# Patient Record
Sex: Male | Born: 1970 | Race: Black or African American | Hispanic: No | Marital: Single | Smoking: Current some day smoker
Health system: Southern US, Community
[De-identification: ages and names within clinical notes are randomized; demographics above are authoritative.]

## PROBLEM LIST (undated history)

## (undated) HISTORY — PX: HERNIA REPAIR: SHX51

## (undated) HISTORY — PX: MENISCUS REPAIR: SHX5179

---

## 2002-01-18 ENCOUNTER — Encounter: Admission: RE | Admit: 2002-01-18 | Discharge: 2002-01-18 | Payer: Self-pay | Admitting: General Surgery

## 2002-01-18 ENCOUNTER — Encounter: Payer: Self-pay | Admitting: General Surgery

## 2002-10-17 ENCOUNTER — Emergency Department (HOSPITAL_COMMUNITY): Admission: EM | Admit: 2002-10-17 | Discharge: 2002-10-17 | Payer: Self-pay | Admitting: Emergency Medicine

## 2006-02-04 ENCOUNTER — Emergency Department (HOSPITAL_COMMUNITY): Admission: EM | Admit: 2006-02-04 | Discharge: 2006-02-04 | Payer: Self-pay | Admitting: Emergency Medicine

## 2010-01-01 ENCOUNTER — Emergency Department (HOSPITAL_COMMUNITY): Admission: EM | Admit: 2010-01-01 | Discharge: 2010-01-01 | Payer: Self-pay | Admitting: Emergency Medicine

## 2011-01-24 ENCOUNTER — Other Ambulatory Visit: Payer: Self-pay | Admitting: Orthopedic Surgery

## 2011-01-24 DIAGNOSIS — M542 Cervicalgia: Secondary | ICD-10-CM

## 2011-01-27 ENCOUNTER — Ambulatory Visit
Admission: RE | Admit: 2011-01-27 | Discharge: 2011-01-27 | Disposition: A | Discharge status: Home / Self Care | Payer: Medicare HMO | Source: Ambulatory Visit | Attending: Orthopedic Surgery | Admitting: Orthopedic Surgery

## 2011-01-27 DIAGNOSIS — M542 Cervicalgia: Secondary | ICD-10-CM

## 2013-08-02 ENCOUNTER — Other Ambulatory Visit (HOSPITAL_COMMUNITY): Payer: Self-pay | Admitting: Specialist

## 2013-08-02 ENCOUNTER — Ambulatory Visit (HOSPITAL_COMMUNITY)
Admission: RE | Admit: 2013-08-02 | Discharge: 2013-08-02 | Disposition: A | Discharge status: Home / Self Care | Payer: BC Managed Care – PPO | Source: Ambulatory Visit | Attending: Specialist | Admitting: Specialist

## 2013-08-02 DIAGNOSIS — M7989 Other specified soft tissue disorders: Secondary | ICD-10-CM

## 2013-08-02 DIAGNOSIS — M25561 Pain in right knee: Secondary | ICD-10-CM

## 2013-08-02 DIAGNOSIS — M79609 Pain in unspecified limb: Secondary | ICD-10-CM | POA: Insufficient documentation

## 2013-09-20 ENCOUNTER — Encounter: Payer: Self-pay | Admitting: Podiatry

## 2013-09-20 ENCOUNTER — Ambulatory Visit (INDEPENDENT_AMBULATORY_CARE_PROVIDER_SITE_OTHER): Payer: BC Managed Care – PPO | Admitting: Podiatry

## 2013-09-20 VITALS — BP 158/97 | HR 96 | Resp 20 | Ht 69.0 in | Wt 310.0 lb

## 2013-09-20 DIAGNOSIS — M722 Plantar fascial fibromatosis: Secondary | ICD-10-CM

## 2013-09-20 NOTE — Patient Instructions (Signed)
Shoe size may need to be increased when orthotics are dispensed. Do not purchase any new shoes until the orthotics are dispensed.

## 2013-09-20 NOTE — Progress Notes (Signed)
  Subjective:    Patient ID: Curtis Warner., male    DOB: 1971-01-17, 42 y.o.   MRN: [id number] "I had a Meniscus tear in my right knee.  I had orthotics before.  I want to get more to see if that will help."  HPI Comments:  N  Orthotics old, worn out L  Orthotics  D  4 yrs. ago O  Gradually C  Gotten worse A  None T  None   Patient is complaining of diffuse foot pain with walking. He has a history of wearing orthotics which relieved his foot pain and secondarily seemed to improve his knee pain. He has reduced his physical activity because of knee pain and foot pain resulting in increase in weight.    Review of Systems  Constitutional: Positive for activity change.  HENT: Negative.   Eyes: Negative.   Respiratory: Negative.   Cardiovascular: Positive for chest pain.  Gastrointestinal: Negative.   Endocrine: Negative.   Genitourinary: Negative.   Musculoskeletal: Positive for gait problem and joint swelling.  Skin: Negative.   Allergic/Immunologic: Positive for food allergies.  Neurological: Negative.   Hematological: Negative.   Psychiatric/Behavioral: Negative.    Surgical history: Recent surgical repair of meniscus  right knee.    Objective:   Physical Exam 42 year old black male appears orientated x3.  Vascular: DP and PT pulses are two over four bilaterally.  Neurological: Sensation intact bilaterally.  Musculoskeletal: Palpable tenderness in the medial fascial bands bilaterally without any palpable lesions. The calcaneal stance position is in a varus position bilaterally. There is no restriction ankle subtalar midtarsal joints bilaterally.        Assessment & Plan:   Assessment: Plantar fasciitis bilaterally Rear foot varus deformity bilaterally  Plan: Recommend custom foot orthotics made from plasterer model. The rear foot is posted 3  varus bilaterally. The forefoot is intrinsic posted to cast. Wide and deep heels and omit plaster from the  longitudinal arch bilaterally. The orthotic is a sport and extends to the MPJ. Obtain a digital scan for fabrication for this custom orthotic as described above.  Notify patient on receipt of orthotic from lab.  Sanye Ledesma C.Leeanne Deed, DPM

## 2013-10-27 ENCOUNTER — Encounter: Payer: Self-pay | Admitting: Podiatry

## 2013-10-27 ENCOUNTER — Ambulatory Visit (INDEPENDENT_AMBULATORY_CARE_PROVIDER_SITE_OTHER): Payer: BC Managed Care – PPO | Admitting: Podiatry

## 2013-10-27 VITALS — BP 149/99 | HR 86 | Resp 12

## 2013-10-27 DIAGNOSIS — M722 Plantar fascial fibromatosis: Secondary | ICD-10-CM

## 2013-10-27 NOTE — Patient Instructions (Signed)

## 2013-10-27 NOTE — Progress Notes (Signed)
   Subjective:    Patient ID: Curtis Rocks., male    DOB: Dec 15, 1970, 42 y.o.   MRN: [id number]  HPI Comments: Dispensed orthotics and given instruction.     Review of Systems     Objective:   Physical Exam Custom orthotics contour satisfactorily        Assessment & Plan:   Assessment: Satisfactory fit of orthotics in treatment of plantar fasciitis bilaterally  Plan: Orthotics dispensed to wear instructions provided, recheck x6 weeks.

## 2013-12-08 ENCOUNTER — Encounter: Payer: Self-pay | Admitting: Podiatry

## 2013-12-08 ENCOUNTER — Ambulatory Visit (INDEPENDENT_AMBULATORY_CARE_PROVIDER_SITE_OTHER): Payer: BC Managed Care – PPO | Admitting: Podiatry

## 2013-12-08 VITALS — BP 148/76 | HR 86 | Resp 12

## 2013-12-08 DIAGNOSIS — M722 Plantar fascial fibromatosis: Secondary | ICD-10-CM

## 2013-12-08 NOTE — Progress Notes (Signed)
Patient ID: Curtis RocksWillie B Noboa Jr., male   DOB: 12/05/1970, 43 y.o.   MRN: [id number] Subjective: Patient presents after wearing orthotics on a continuous basis since the visit of 10/27/2013. At this time he says his feet are comfortable, denying any foot pain and as well as reduce knee pain.  Objective: No palpable tenderness in the right and left feet. No visible signs of skin irritation from orthotics bilaterally.  Assessment: Satisfactory fit of orthotics in resolve bilateral fasciitis  Plan: Continue wearing orthotics an ongoing daily basis. Patient is discharged.

## 2015-10-07 ENCOUNTER — Emergency Department (HOSPITAL_COMMUNITY)
Admission: EM | Admit: 2015-10-07 | Discharge: 2015-10-07 | Disposition: A | Discharge status: Home / Self Care | Payer: BLUE CROSS/BLUE SHIELD | Attending: Emergency Medicine | Admitting: Emergency Medicine

## 2015-10-07 ENCOUNTER — Emergency Department (HOSPITAL_COMMUNITY): Payer: BLUE CROSS/BLUE SHIELD

## 2015-10-07 ENCOUNTER — Encounter (HOSPITAL_COMMUNITY): Payer: Self-pay | Admitting: Neurology

## 2015-10-07 DIAGNOSIS — M5431 Sciatica, right side: Secondary | ICD-10-CM | POA: Diagnosis not present

## 2015-10-07 DIAGNOSIS — G8929 Other chronic pain: Secondary | ICD-10-CM | POA: Insufficient documentation

## 2015-10-07 DIAGNOSIS — R079 Chest pain, unspecified: Secondary | ICD-10-CM | POA: Insufficient documentation

## 2015-10-07 DIAGNOSIS — F17200 Nicotine dependence, unspecified, uncomplicated: Secondary | ICD-10-CM | POA: Insufficient documentation

## 2015-10-07 DIAGNOSIS — R109 Unspecified abdominal pain: Secondary | ICD-10-CM | POA: Diagnosis not present

## 2015-10-07 DIAGNOSIS — M25551 Pain in right hip: Secondary | ICD-10-CM | POA: Diagnosis present

## 2015-10-07 LAB — BASIC METABOLIC PANEL
Anion gap: 7 (ref 5–15)
BUN: 13 mg/dL (ref 6–20)
CALCIUM: 9.1 mg/dL (ref 8.9–10.3)
CHLORIDE: 109 mmol/L (ref 101–111)
CO2: 21 mmol/L — AB (ref 22–32)
CREATININE: 1.08 mg/dL (ref 0.61–1.24)
GFR calc non Af Amer: >60 mL/min (ref >60)
GLUCOSE: 119 mg/dL — AB (ref 65–99)
Potassium: 3.7 mmol/L (ref 3.5–5.1)
Sodium: 137 mmol/L (ref 135–145)

## 2015-10-07 LAB — CBC
HCT: 45.3 % (ref 39.0–52.0)
Hemoglobin: 14.6 g/dL (ref 13.0–17.0)
MCH: 25.6 pg — AB (ref 26.0–34.0)
MCHC: 32.2 g/dL (ref 30.0–36.0)
MCV: 79.3 fL (ref 78.0–100.0)
PLATELETS: 239 10*3/uL (ref 150–400)
RBC: 5.71 MIL/uL (ref 4.22–5.81)
RDW: 14 % (ref 11.5–15.5)
WBC: 7.9 10*3/uL (ref 4.0–10.5)

## 2015-10-07 LAB — I-STAT TROPONIN, ED: TROPONIN I, POC: 0.01 ng/mL (ref 0.00–0.08)

## 2015-10-07 MED ORDER — MORPHINE SULFATE (PF) 4 MG/ML IV SOLN
4.0000 mg | Freq: Once | INTRAVENOUS | Status: AC
  Administered 2015-10-07: 4 mg via INTRAVENOUS
  Filled 2015-10-07: qty 1

## 2015-10-07 MED ORDER — OXYCODONE-ACETAMINOPHEN 5-325 MG PO TABS: 1.0000 | ORAL_TABLET | Freq: Four times a day (QID) | ORAL | Status: AC | PRN

## 2015-10-07 MED ORDER — CYCLOBENZAPRINE HCL 10 MG PO TABS: 10.0000 mg | ORAL_TABLET | Freq: Three times a day (TID) | ORAL | Status: AC | PRN

## 2015-10-07 MED ORDER — KETOROLAC TROMETHAMINE 30 MG/ML IJ SOLN
30.0000 mg | Freq: Once | INTRAMUSCULAR | Status: AC
  Administered 2015-10-07: 30 mg via INTRAVENOUS
  Filled 2015-10-07: qty 1

## 2015-10-07 MED ORDER — HYDROMORPHONE HCL 1 MG/ML IJ SOLN
1.0000 mg | Freq: Once | INTRAMUSCULAR | Status: AC
  Administered 2015-10-07: 1 mg via INTRAVENOUS
  Filled 2015-10-07: qty 1

## 2015-10-07 MED ORDER — PREDNISONE 50 MG PO TABS: 50.0000 mg | ORAL_TABLET | Freq: Every day | ORAL | Status: AC

## 2015-10-07 NOTE — Discharge Instructions (Signed)
Return here as needed.  Follow-up with your urgent care doctor.  Use ice and heat on your back and hip

## 2015-10-07 NOTE — ED Provider Notes (Signed)
CSN: [id number]     Arrival date & time 10/07/15  [id number] History   First MD Initiated Contact with Patient 10/07/15 1009     Chief Complaint  Patient presents with  . Chest Pain  . Hip Pain     (Consider location/radiation/quality/duration/timing/severity/associated sxs/prior Treatment) HPI Patient presents to the emergency department with chest pain in the left upper chest near his clavicle, but is mainly here for right hip pain that is severe with movement and palpation.  The patient states all this started 6:30 this morning.  The chest pain has been ongoing for longer than this, but he also noticed he had pain this morning in his chest.  Patient states that he is not have any shortness breath, nausea, vomiting, fever, cough, sore throat, neck pain, weakness, dizziness, headache, blurred vision, near syncope or syncope.  The patient states that she did not take any medications prior to arrival.  He states that he has had back pain in the past History reviewed. No pertinent past medical history. Past Surgical History  Procedure Laterality Date  . Hernia repair    . Meniscus repair Right    Family History  Problem Relation Age of Onset  . Diabetes Father   . Blindness Father    Social History  Substance Use Topics  . Smoking status: Current Some Day Smoker  . Smokeless tobacco: None  . Alcohol Use: 0.0 oz/week     Comment: beer once in a while    Review of Systems  All other systems negative except as documented in the HPI. All pertinent positives and negatives as reviewed in the HPI.  Allergies  Shellfish allergy  Home Medications   Prior to Admission medications   Not on File   BP 144/97 mmHg  Pulse 92  Temp(Src) 97.6 F (36.4 C) (Oral)  Resp 16  SpO2 99% Physical Exam  Constitutional: He is oriented to person, place, and time. He appears well-developed and well-nourished. No distress.  HENT:  Head: Normocephalic and atraumatic.  Mouth/Throat: Oropharynx is  clear and moist.  Eyes: Pupils are equal, round, and reactive to light.  Neck: Normal range of motion. Neck supple.  Cardiovascular: Normal rate, regular rhythm and normal heart sounds.  Exam reveals no gallop and no friction rub.   No murmur heard. Pulmonary/Chest: Effort normal and breath sounds normal. No respiratory distress. He has no wheezes.  Abdominal: Soft. Bowel sounds are normal. He exhibits no distension. There is no rebound and no guarding.    Musculoskeletal:       Right hip: He exhibits tenderness. He exhibits normal range of motion, normal strength, no swelling, no crepitus and no deformity.       Back:  Neurological: He is alert and oriented to person, place, and time. He exhibits normal muscle tone. Coordination normal.  Skin: Skin is warm and dry. No rash noted. No erythema.  Psychiatric: He has a normal mood and affect. His behavior is normal.  Nursing note and vitals reviewed.   ED Course  Procedures (including critical care time) Labs Review Labs Reviewed  CBC - Abnormal; Notable for the following:    MCH 25.6 (*)    All other components within normal limits  BASIC METABOLIC PANEL  I-STAT TROPOININ, ED    Imaging Review No results found. I have personally reviewed and evaluated these images and lab results as part of my medical decision-making.   EKG Interpretation   Date/Time:  Saturday October 07 2015 09:44:30 EST Ventricular  Rate:  86 PR Interval:  190 QRS Duration: 102 QT Interval:  372 QTC Calculation: 445 R Axis:   106 Text Interpretation:  Normal sinus rhythm Rightward axis Incomplete right  bundle branch block Borderline ECG No old tracing to compare Confirmed by  GOLDSTON  MD, SCOTT ([id number]) on 10/07/2015 10:24:34 AM     The patient's chest pain has been chronic in nature.  It is atypical for cardiac chest pain as a constant limits present.  The patient most likely has a radicular symptom causing his hip pain that radiates down his leg.   He does not have any neurological deficits.  He is feeling better following pain medications.  CT scan of his abdomen did not show any significant abnormality.  She is advised follow-up with his primary care Dr. told to return here as needed    Charlestine Night, PA-C 10/09/15 2211  Pricilla Loveless, MD 10/12/15 [phone]

## 2015-10-07 NOTE — ED Notes (Signed)
Pt reports woke up this morning at 0630 with left sided cp that is intermittent and right hip pain. Denies any fall or injuries. Cp stays in same spot, comes and go but is tender to touch. Denies n/v/d

## 2016-04-20 IMAGING — DX DG HIP (WITH OR WITHOUT PELVIS) 2-3V*R*
3 series · 3 of 3 positions shown · non-contrast
Comparison: None.

CLINICAL DATA: Right hip pain starting today, no known injury

EXAM:
DG HIP (WITH OR WITHOUT PELVIS) 2-3V RIGHT

[pelvis ap]
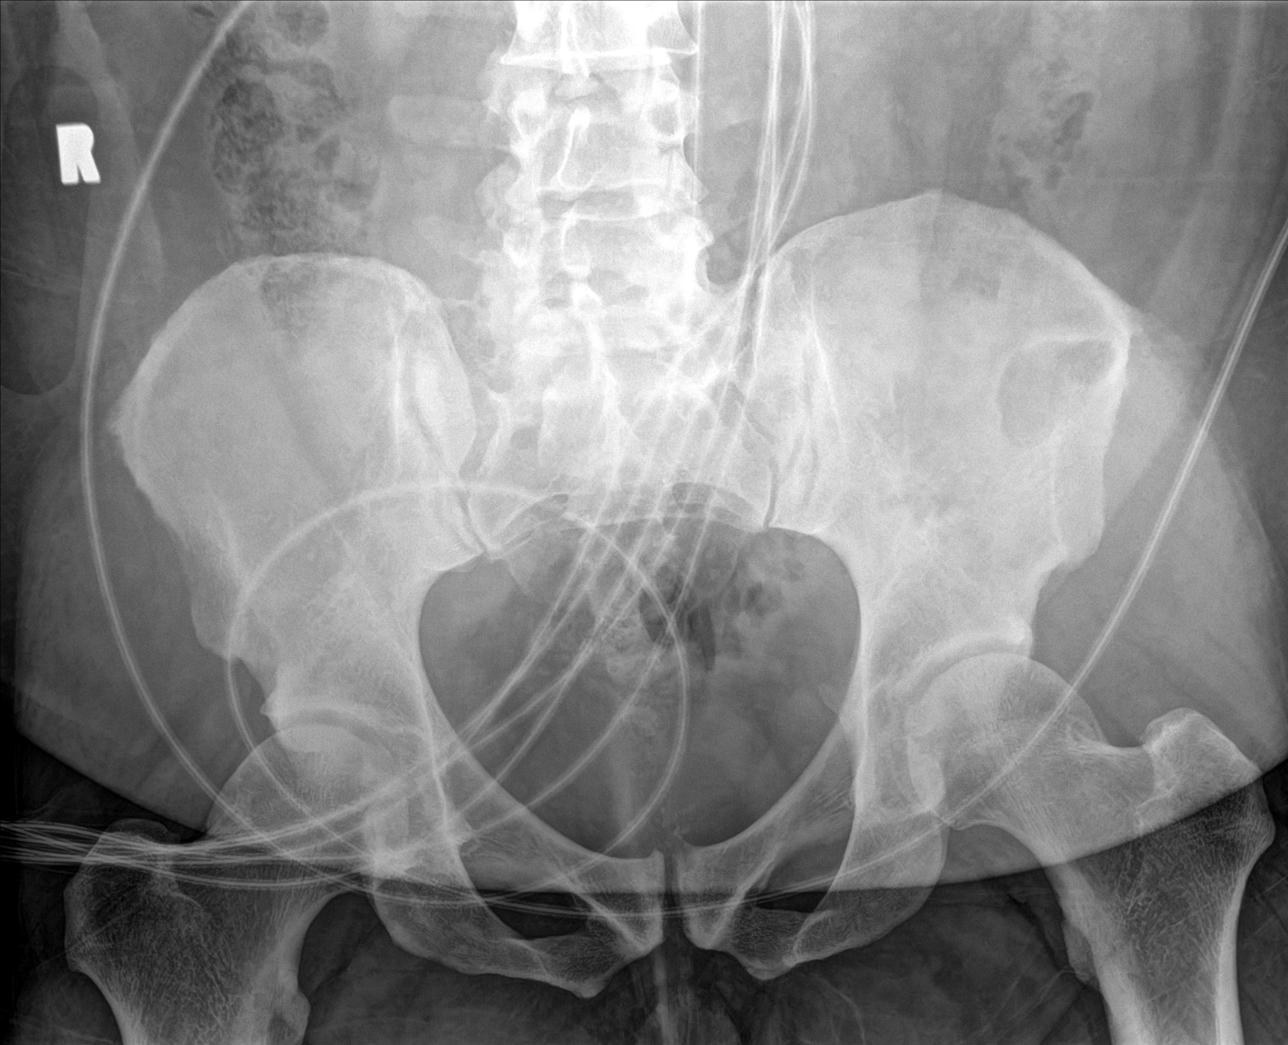

[hip ap]
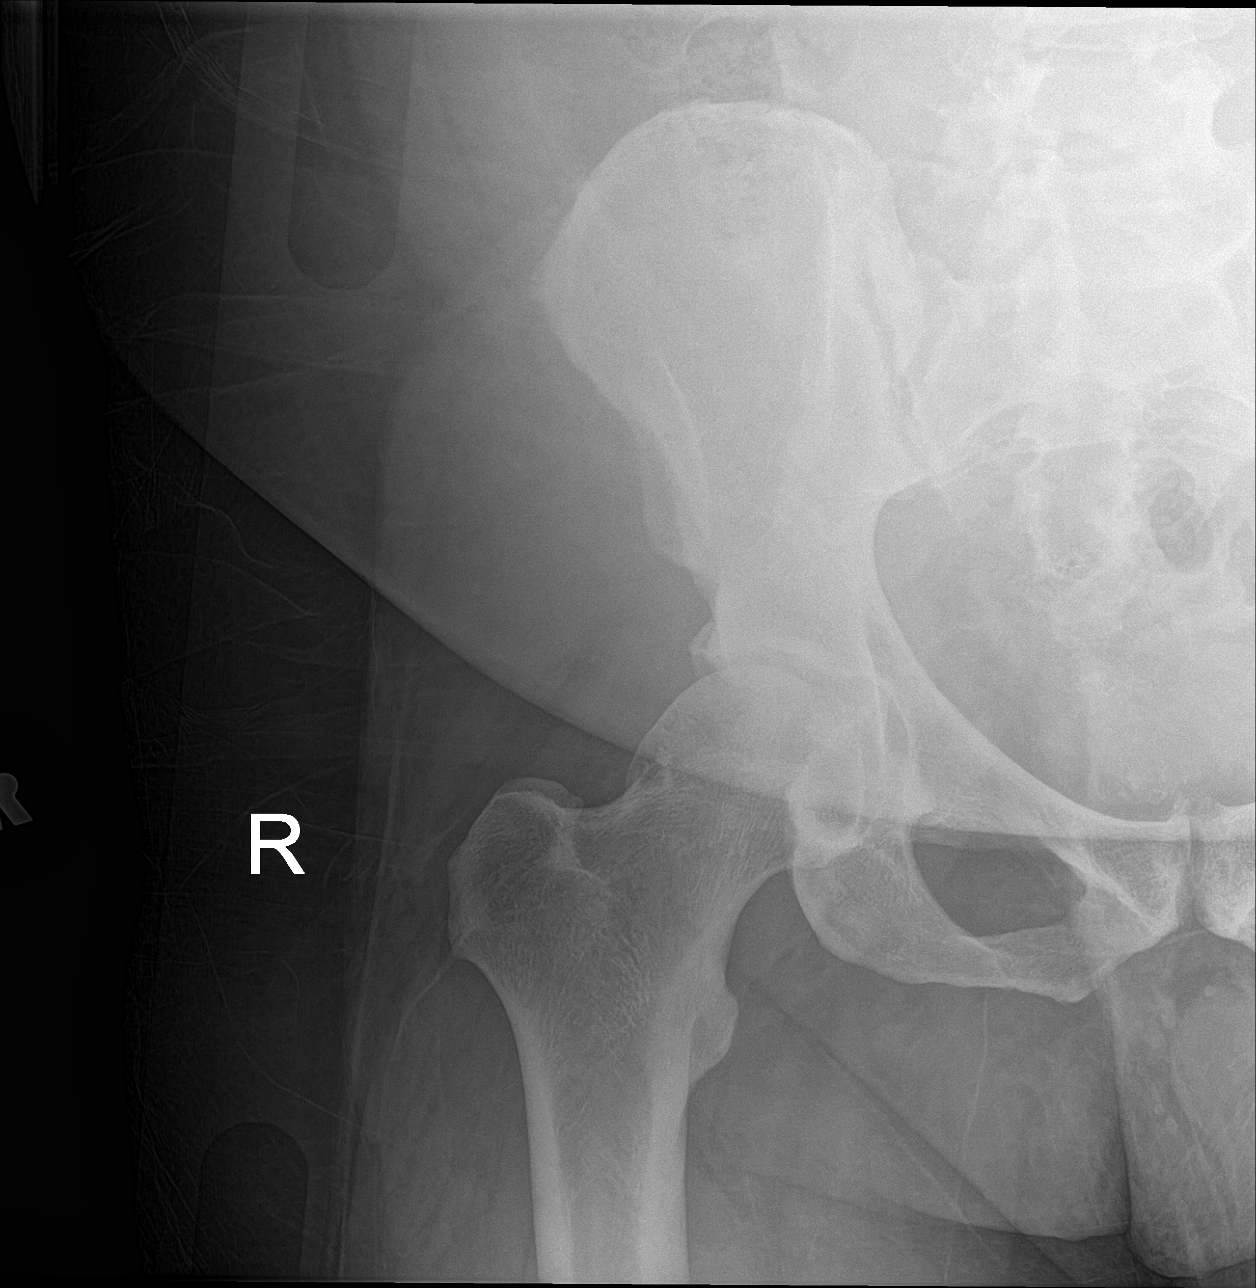

[hip lat]
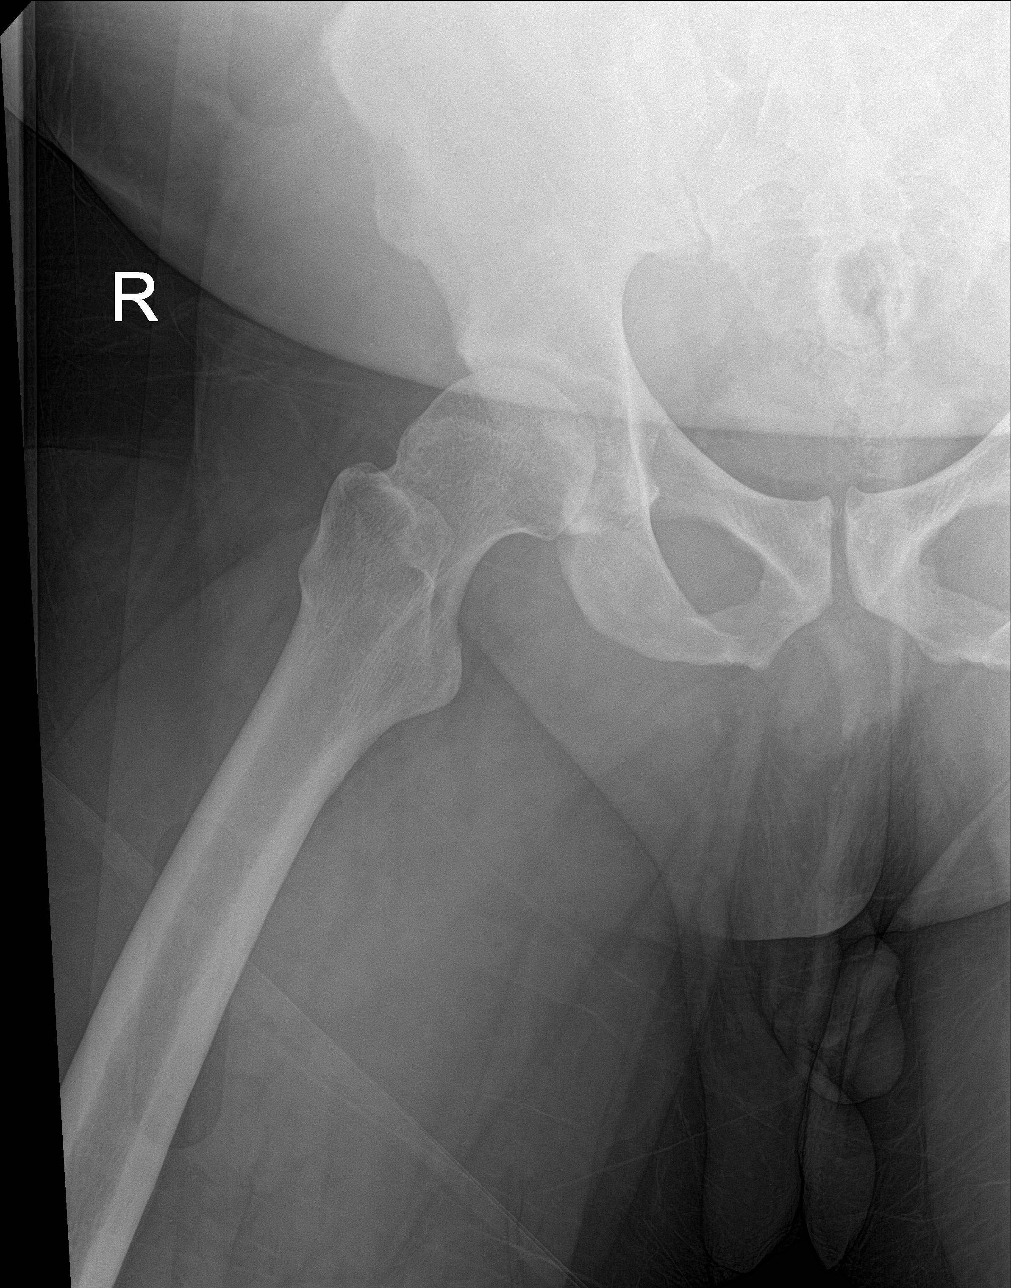

[3 of 3 positions shown; findings below may reference images not displayed]

FINDINGS: Three views of the right hip submitted. No acute fracture or
subluxation. Bilateral hip joints are symmetrical in appearance.
Minimal superior right acetabular spurring.
IMPRESSION: No acute fracture or subluxation.  Minimal degenerative changes.
